# Patient Record
Sex: Male | Born: 1975 | Race: White | Hispanic: No | Marital: Single | Smoking: Never smoker
Health system: Southern US, Community
[De-identification: ages and names within clinical notes are randomized; demographics above are authoritative.]

## PROBLEM LIST (undated history)

## (undated) DIAGNOSIS — L570 Actinic keratosis: Secondary | ICD-10-CM

## (undated) HISTORY — DX: Actinic keratosis: L57.0

---

## 2007-06-14 ENCOUNTER — Emergency Department: Payer: Self-pay | Admitting: Internal Medicine

## 2009-02-27 ENCOUNTER — Ambulatory Visit: Payer: Self-pay | Admitting: General Surgery

## 2015-11-06 ENCOUNTER — Ambulatory Visit (INDEPENDENT_AMBULATORY_CARE_PROVIDER_SITE_OTHER): Payer: Worker's Compensation | Admitting: Physician Assistant

## 2015-11-06 VITALS — BP 122/72 | HR 55 | Temp 98.4°F | Resp 17 | Ht 69.0 in | Wt 171.0 lb

## 2015-11-06 DIAGNOSIS — S29012A Strain of muscle and tendon of back wall of thorax, initial encounter: Secondary | ICD-10-CM | POA: Diagnosis not present

## 2015-11-06 DIAGNOSIS — S233XXA Sprain of ligaments of thoracic spine, initial encounter: Secondary | ICD-10-CM | POA: Diagnosis not present

## 2015-11-06 MED ORDER — CYCLOBENZAPRINE HCL 10 MG PO TABS: 10.0000 mg | ORAL_TABLET | Freq: Three times a day (TID) | ORAL | Status: AC | PRN

## 2015-11-06 MED ORDER — NAPROXEN SODIUM 550 MG PO TABS: 550.0000 mg | ORAL_TABLET | Freq: Two times a day (BID) | ORAL | Status: AC

## 2015-11-06 NOTE — Progress Notes (Signed)
[facility] [address], [place] [address] [phone]  Date:  11/06/2015   Name:  Hector Vega   DOB:  03/14/1976   MRN:  [id number]  PCP:  No primary care provider on file.    Chief Complaint: Back Pain   History of Present Illness:  This is a 40 y.o. male who is presenting with back pain that occurred at work earlier this morning. A 200 pound fabric roll started to roll and he twisted his trunk to the left and grabbed the fabric roll and he got a sharp pain in his thoracic mid back. Pain is not radiating. No pain into arms or legs. Denies paresthesias, weakness, problems with bowel or bladder. Never injured back before. He does get back soreness every so often. He did take 800 mg ibuprofen before work this morning for back soreness.   Review of Systems:  Review of Systems See HPI  There are no active problems to display for this patient.   Prior to Admission medications   Not on File    Not on File  History reviewed. No pertinent past surgical history.  Medication list has been reviewed and updated.  Physical Examination:  Physical Exam  Constitutional: He is oriented to person, place, and time. He appears well-developed and well-nourished. No distress.  HENT:  Head: Normocephalic and atraumatic.  Right Ear: Hearing normal.  Left Ear: Hearing normal.  Nose: Nose normal.  Eyes: Conjunctivae and lids are normal. Right eye exhibits no discharge. Left eye exhibits no discharge. No scleral icterus.  Cardiovascular: Normal rate, regular rhythm, normal heart sounds and normal pulses.   No murmur heard. Pulmonary/Chest: Effort normal and breath sounds normal. No respiratory distress. He has no decreased breath sounds. He has no wheezes. He has no rhonchi. He has no rales.  Musculoskeletal: Normal range of motion.       Thoracic back: He exhibits normal range of motion, no tenderness and no bony tenderness.       Lumbar back: Normal.  He exhibits normal range of motion, no tenderness and no bony tenderness.  Pain with rotation of trunk. No tenderness in back  Neurological: He is alert and oriented to person, place, and time. He has normal strength and normal reflexes. No sensory deficit. Gait normal.  Skin: Skin is warm, dry and intact. No lesion and no rash noted.  Psychiatric: He has a normal mood and affect. His speech is normal and behavior is normal. Thought content normal.   BP 122/72 mmHg  Pulse 55  Temp(Src) 98.4 F (36.9 C) (Oral)  Resp 17  Ht 5\' 9"  (1.753 m)  Wt 171 lb (77.565 kg)  BMI 25.24 kg/m2  SpO2 99%  Assessment and Plan:  1. Thoracic sprain and strain, initial encounter Neuro exam normal, no bony tenderness - radiograph not indicated. Treat for strain with anaprox and flexeril. Counseled on heat, gentle massage, gentle stretching. He will be light duty at work and follow up on 11/13/15. - naproxen sodium (ANAPROX) 550 MG tablet; Take 1 tablet (550 mg total) by mouth 2 (two) times daily with a meal.  Dispense: 30 tablet; Refill: 0 - cyclobenzaprine (FLEXERIL) 10 MG tablet; Take 1 tablet (10 mg total) by mouth 3 (three) times daily as needed for muscle spasms.  Dispense: 30 tablet; Refill: 0   Benjaman Pott. Drenda Freeze, MHS [facility]  11/06/2015

## 2015-11-06 NOTE — Patient Instructions (Addendum)
Use anaprox twice daily. Do not use any other products with this containing ibuprofen, naprosyn or aspirin. You MAY use tylenol with this. Take flexeril at night. May cut in half and taking during the day if it doesn't make you too drowsy Heat, gentle massage and gentle stretching can help. Remain active, as inactivity can cause more pain. Don't do anything too strenuous though. If you develop new numbness, weakness or incontinence of urine or bowel, return to clinic or go to the emergency room ASAP. Return in 1 week for recheck or sooner if needed.     IF you received an x-ray today, you will receive an invoice from [facility]. Please contact [facility] at [phone] with questions or concerns regarding your invoice.   IF you received labwork today, you will receive an invoice from [employer]. Please contact [facility] at [phone] with questions or concerns regarding your invoice.   Our billing staff will not be able to assist you with questions regarding bills from these companies.  You will be contacted with the lab results as soon as they are available. The fastest way to get your results is to activate your My Chart account. Instructions are located on the last page of this paperwork. If you have not heard from [place] regarding the results in 2 weeks, please contact this office.

## 2015-11-13 ENCOUNTER — Ambulatory Visit (INDEPENDENT_AMBULATORY_CARE_PROVIDER_SITE_OTHER): Payer: Worker's Compensation | Admitting: Physician Assistant

## 2015-11-13 VITALS — BP 120/80 | HR 64 | Temp 97.6°F | Resp 18 | Ht 69.0 in | Wt 173.0 lb

## 2015-11-13 DIAGNOSIS — S29012D Strain of muscle and tendon of back wall of thorax, subsequent encounter: Secondary | ICD-10-CM

## 2015-11-13 DIAGNOSIS — S233XXD Sprain of ligaments of thoracic spine, subsequent encounter: Secondary | ICD-10-CM

## 2015-11-13 NOTE — Patient Instructions (Signed)
     IF you received an x-ray today, you will receive an invoice from [facility]. Please contact [facility] at [phone] with questions or concerns regarding your invoice.   IF you received labwork today, you will receive an invoice from [facility]. Please contact [facility] at [phone] with questions or concerns regarding your invoice.   Our billing staff will not be able to assist you with questions regarding bills from these companies.  You will be contacted with the lab results as soon as they are available. The fastest way to get your results is to activate your My Chart account. Instructions are located on the last page of this paperwork. If you have not heard from us regarding the results in 2 weeks, please contact this office.      

## 2015-11-13 NOTE — Progress Notes (Signed)
[facility] [address], [place] [address] [phone]  Date:  11/13/2015   Name:  Hector Vega   DOB:  1975/06/01   MRN:  [id number]  PCP:  No primary care provider on file.    Chief Complaint: Follow-up (w/c)   History of Present Illness:  This is a 40 y.o. male who is presenting for w/c follow up. I saw pt here 1 week ago on 7/18 after back injury at work. He started having thoracic back pain after trying to stop a 200 pound fabric roll from rolling, he twisted his trunk to brace the fabric roll and that's when he got pain. Neuro exam normal and no bony tenderness at that time. Diagnosed with thoracic strain. He was prescribed anaprox and flexeril. These have helped him.  He is reporting today that he is 80% better. He stayed out of work until yesterday. Went back full duty yesterday. States his back felt sore after work but overall he did well. Really only has pain with rotating his back. He is ready to go back to work full duty.  Denies paresthesias, radiation of pain, leg or arm weakness, problems with bowel or bladder.  Review of Systems:  Review of Systems See HPI  There are no active problems to display for this patient.   Prior to Admission medications   Medication Sig Start Date End Date Taking? Authorizing Provider  cyclobenzaprine (FLEXERIL) 10 MG tablet Take 1 tablet (10 mg total) by mouth 3 (three) times daily as needed for muscle spasms. 11/06/15   Ezekiel Slocumb, PA-C  naproxen sodium (ANAPROX) 550 MG tablet Take 1 tablet (550 mg total) by mouth 2 (two) times daily with a meal. 11/06/15   Ezekiel Slocumb, PA-C    History reviewed. No pertinent surgical history.  Medication list has been reviewed and updated.  Physical Examination:  Physical Exam  Constitutional: He is oriented to person, place, and time. He appears well-developed and well-nourished. No distress.  HENT:  Head: Normocephalic and atraumatic.  Right Ear: Hearing  normal.  Left Ear: Hearing normal.  Nose: Nose normal.  Eyes: Conjunctivae and lids are normal. Right eye exhibits no discharge. Left eye exhibits no discharge. No scleral icterus.  Pulmonary/Chest: Effort normal. No respiratory distress.  Musculoskeletal: Normal range of motion.       Thoracic back: Normal. He exhibits normal range of motion, no tenderness and no bony tenderness.  Pain in left thoracic region with rotation of trunk.  Neurological: He is alert and oriented to person, place, and time.  Skin: Skin is warm, dry and intact. No lesion and no rash noted.  Psychiatric: He has a normal mood and affect. His speech is normal and behavior is normal. Thought content normal.   BP 120/80   Pulse 64   Temp 97.6 F (36.4 C) (Oral)   Resp 18   Ht 5\' 9"  (1.753 m)   Wt 173 lb (78.5 kg)   SpO2 100%   BMI 25.55 kg/m   Assessment and Plan:  1. Thoracic sprain and strain, subsequent encounter 80% improved. He is ready to go back to work full duty. We discussed proper body mechanics with lifting and transferring. Continue anaprox and flexeril as needed. Return if needed.   Benjaman Pott Drenda Freeze, MHS [facility]  11/13/2015

## 2017-09-17 ENCOUNTER — Other Ambulatory Visit: Payer: Self-pay | Admitting: Internal Medicine

## 2017-09-17 DIAGNOSIS — R918 Other nonspecific abnormal finding of lung field: Secondary | ICD-10-CM

## 2017-09-23 ENCOUNTER — Encounter (INDEPENDENT_AMBULATORY_CARE_PROVIDER_SITE_OTHER): Payer: Self-pay

## 2017-09-23 ENCOUNTER — Ambulatory Visit
Admission: RE | Admit: 2017-09-23 | Discharge: 2017-09-23 | Disposition: A | Discharge status: Home / Self Care | Payer: BC Managed Care – PPO | Source: Ambulatory Visit | Attending: Internal Medicine | Admitting: Internal Medicine

## 2017-09-23 DIAGNOSIS — R918 Other nonspecific abnormal finding of lung field: Secondary | ICD-10-CM | POA: Insufficient documentation

## 2017-09-23 MED ORDER — IOPAMIDOL (ISOVUE-300) INJECTION 61%
100.0000 mL | Freq: Once | INTRAVENOUS | Status: AC | PRN
  Administered 2017-09-23: 75 mL via INTRAVENOUS

## 2019-02-18 ENCOUNTER — Other Ambulatory Visit: Payer: Self-pay

## 2019-02-18 DIAGNOSIS — Z20822 Contact with and (suspected) exposure to covid-19: Secondary | ICD-10-CM

## 2019-02-19 LAB — NOVEL CORONAVIRUS, NAA: SARS-CoV-2, NAA: NOT DETECTED

## 2019-07-06 IMAGING — CT CT CHEST W/ CM
1 series · 15 of 34 positions shown, 19 images · IV contrast (iopamidol)
Comparison: Previous chest x-ray is not available for comparison.

CLINICAL DATA: Abnormal chest x-ray.  Pulmonary nodules.

EXAM:
CT CHEST WITH CONTRAST
TECHNIQUE: Multidetector CT imaging of the chest was performed during
intravenous contrast administration.
CONTRAST:  75mL 9LOB9Z-OMM IOPAMIDOL (9LOB9Z-OMM) INJECTION 61%

[Series 2: axial st · axial · 0.70mm/px · z∈[-563,-275]mm · 15 of 170 slices shown, 19 images]
[im 13/170  mediastinal]
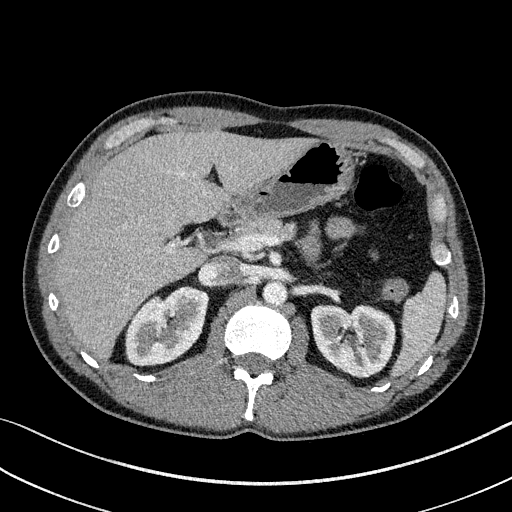
[im 13/170  lung]
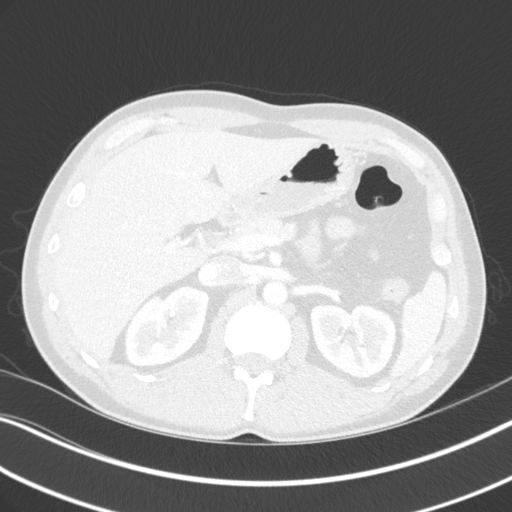
[im 26/170  lung]
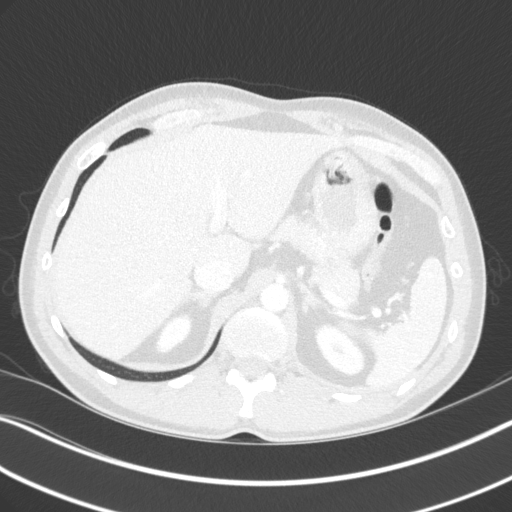
[im 34/170  lung]
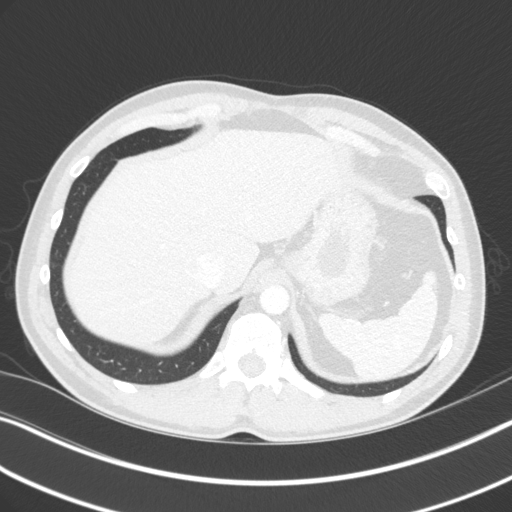
[im 44/170  lung]
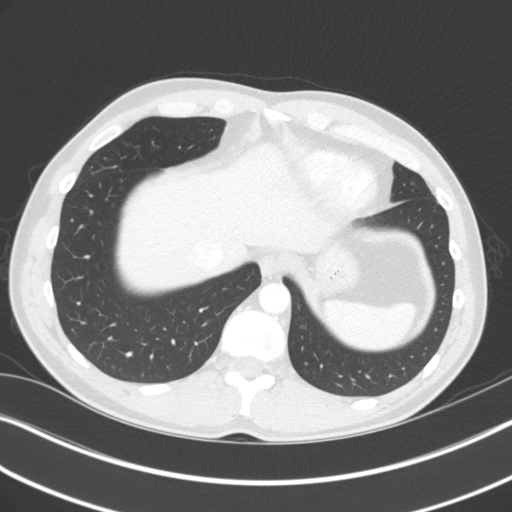
[im 57/170  mediastinal]
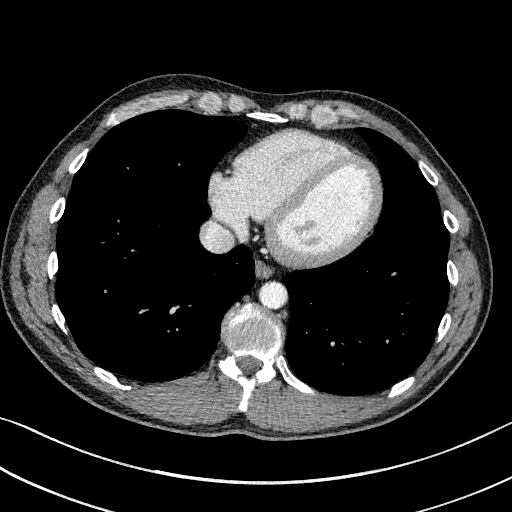
[im 57/170  lung]
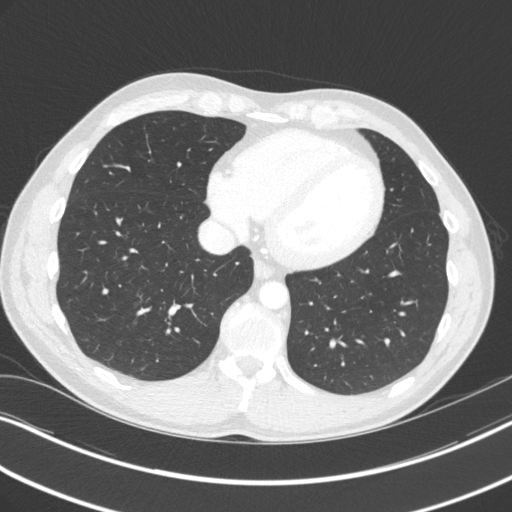
[im 68/170  lung]
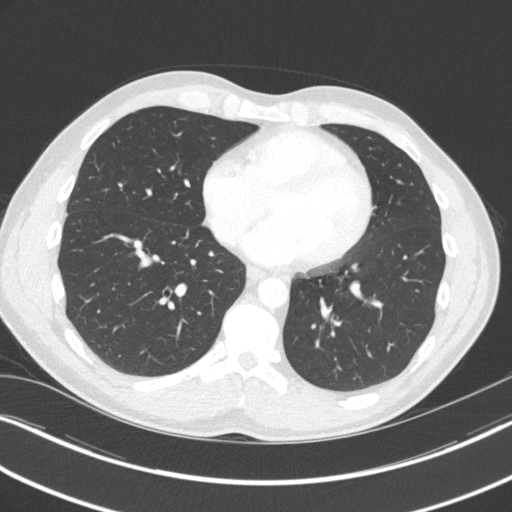
[im 76/170  lung]
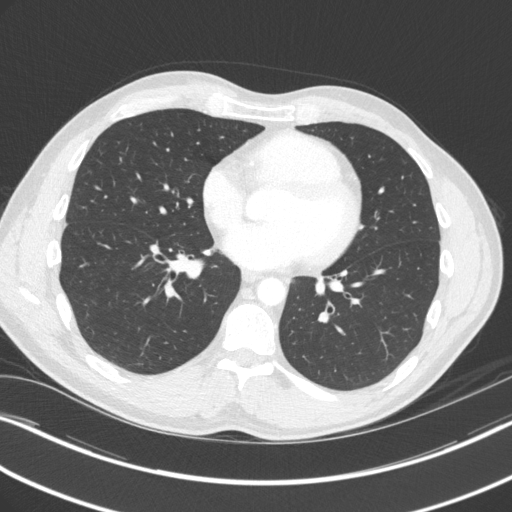
[im 88/170  lung]
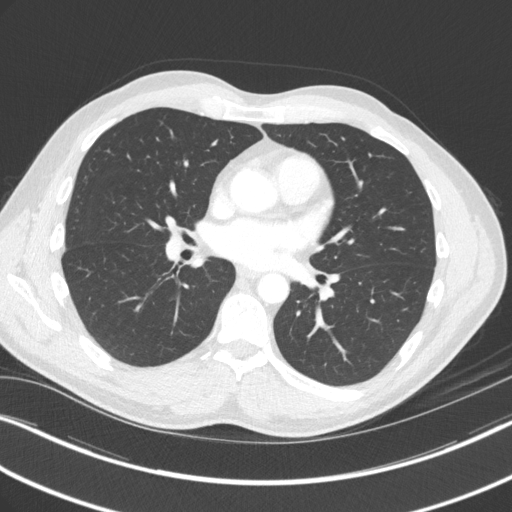
[im 94/170  mediastinal]
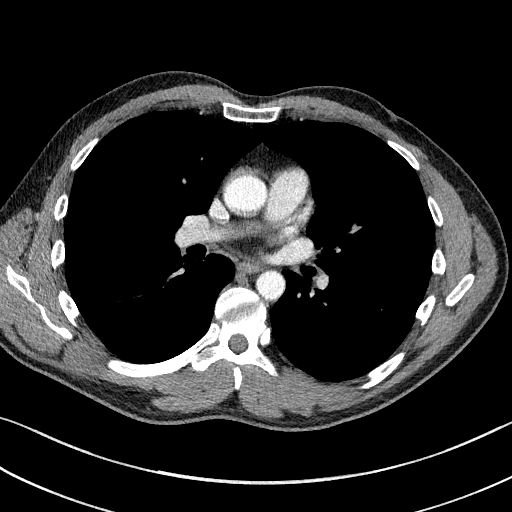
[im 94/170  lung]
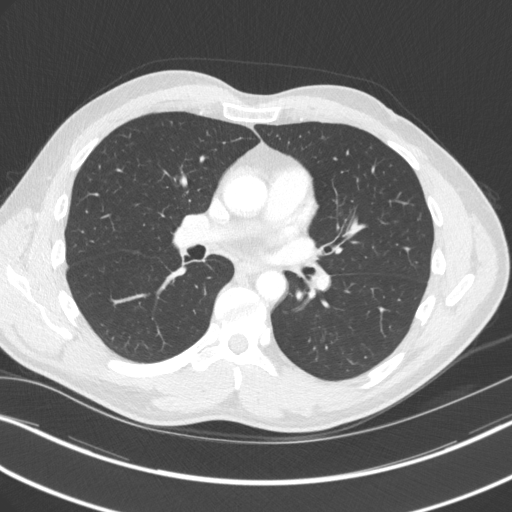
[im 102/170  lung]
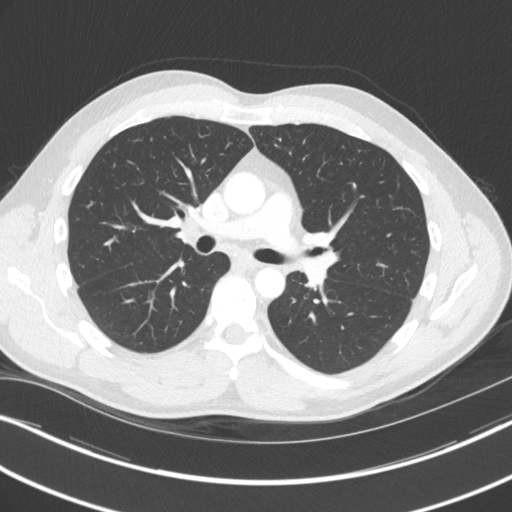
[im 113/170  lung]
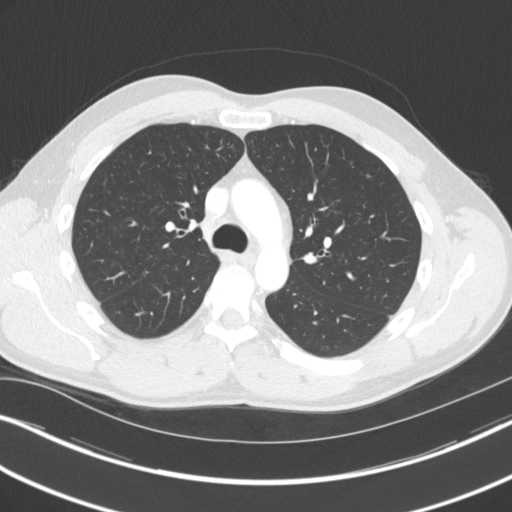
[im 126/170  lung]
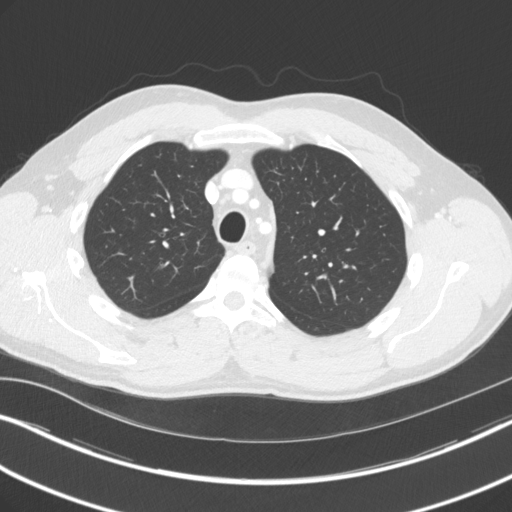
[im 136/170  mediastinal]
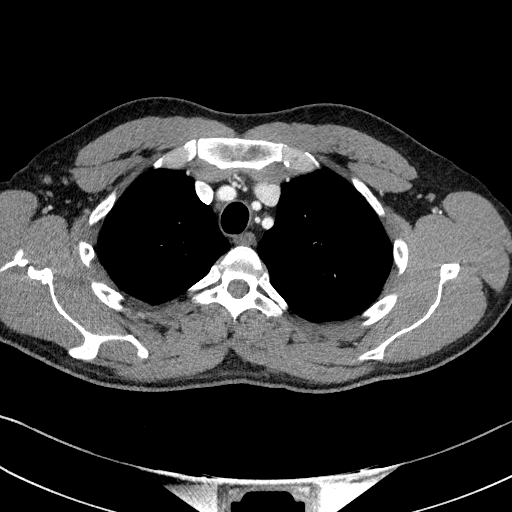
[im 136/170  lung]
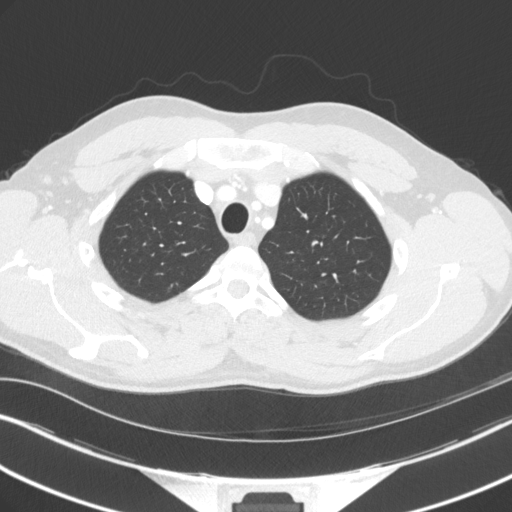
[im 144/170  lung]
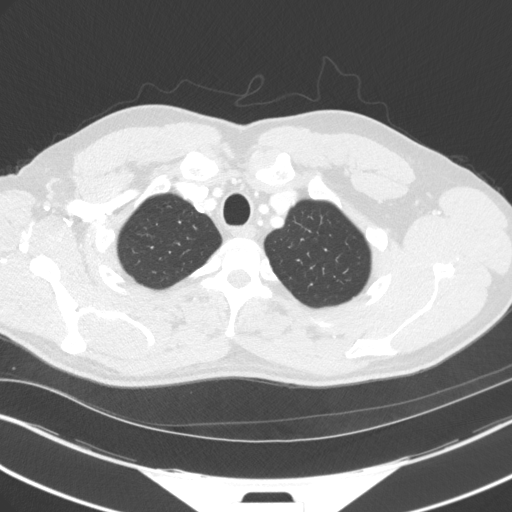
[im 157/170  lung]
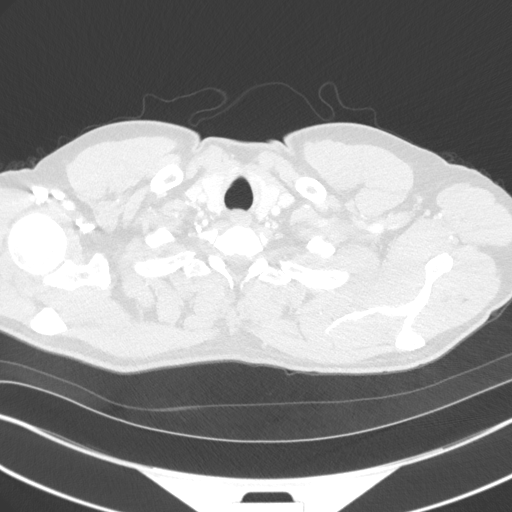

[15 of 34 positions shown; findings below may reference images not displayed]

FINDINGS: Cardiovascular: Heart size is normal. Aorta and great vessel origins
are within normal limits. The left vertebral artery originates
directly from the aortic arch, a normal variant. Pulmonary arteries
are within normal limits.

Mediastinum/Nodes: No significant mediastinal, hilar, or axillary
adenopathy is present. Thoracic inlet is within normal limits.
Esophagus is unremarkable.

Lungs/Pleura: The lungs are clear without focal nodule, mass, or
airspace disease. No significant pleural or pericardial effusion is
present.

Upper Abdomen: Limited imaging of the upper abdomen is unremarkable.

Musculoskeletal: Vertebral body heights and alignment are
maintained. No focal lytic or blastic lesions are present. The ribs
are unremarkable. Sternum is within normal limits.
IMPRESSION: 1. No significant pulmonary nodules or focal lung disease.
2. Negative CT of the chest with contrast.

## 2019-12-28 ENCOUNTER — Other Ambulatory Visit: Payer: BC Managed Care – PPO

## 2019-12-28 ENCOUNTER — Other Ambulatory Visit: Payer: Self-pay

## 2019-12-28 DIAGNOSIS — Z20822 Contact with and (suspected) exposure to covid-19: Secondary | ICD-10-CM

## 2019-12-30 LAB — NOVEL CORONAVIRUS, NAA: SARS-CoV-2, NAA: NOT DETECTED

## 2019-12-30 LAB — SARS-COV-2, NAA 2 DAY TAT

## 2020-01-02 ENCOUNTER — Other Ambulatory Visit: Payer: BC Managed Care – PPO

## 2020-01-02 ENCOUNTER — Other Ambulatory Visit: Payer: Self-pay

## 2020-01-02 DIAGNOSIS — Z20822 Contact with and (suspected) exposure to covid-19: Secondary | ICD-10-CM

## 2020-01-03 LAB — NOVEL CORONAVIRUS, NAA: SARS-CoV-2, NAA: NOT DETECTED

## 2020-01-03 LAB — SARS-COV-2, NAA 2 DAY TAT

## 2020-01-09 ENCOUNTER — Other Ambulatory Visit: Payer: Self-pay

## 2020-01-09 ENCOUNTER — Other Ambulatory Visit: Payer: BC Managed Care – PPO

## 2020-01-09 DIAGNOSIS — Z20822 Contact with and (suspected) exposure to covid-19: Secondary | ICD-10-CM

## 2020-01-10 LAB — SARS-COV-2, NAA 2 DAY TAT

## 2020-01-10 LAB — NOVEL CORONAVIRUS, NAA: SARS-CoV-2, NAA: DETECTED — AB

## 2020-01-11 ENCOUNTER — Telehealth: Payer: Self-pay | Admitting: Adult Health

## 2020-01-11 NOTE — Telephone Encounter (Signed)
Called to Discuss with patient about Covid symptoms and the use of the monoclonal antibody infusion for those with mild to moderate Covid symptoms and at a high risk of hospitalization.     Pt is qualified for this infusion due to co-morbid conditions and/or a member of an at-risk group.   BMI   Patient declines infusion at this time. Feeling better w/ minimal symptoms   Tranise Forrest NP-C

## 2021-01-30 ENCOUNTER — Ambulatory Visit: Payer: Self-pay | Admitting: Dermatology

## 2021-07-01 ENCOUNTER — Ambulatory Visit: Payer: Self-pay | Admitting: Dermatology

## 2021-07-31 ENCOUNTER — Ambulatory Visit: Payer: Self-pay | Admitting: Dermatology

## 2021-12-19 ENCOUNTER — Ambulatory Visit: Payer: BC Managed Care – PPO | Admitting: Dermatology

## 2021-12-19 ENCOUNTER — Encounter: Payer: Self-pay | Admitting: Dermatology

## 2021-12-19 DIAGNOSIS — L821 Other seborrheic keratosis: Secondary | ICD-10-CM

## 2021-12-19 DIAGNOSIS — L578 Other skin changes due to chronic exposure to nonionizing radiation: Secondary | ICD-10-CM | POA: Diagnosis not present

## 2021-12-19 DIAGNOSIS — L918 Other hypertrophic disorders of the skin: Secondary | ICD-10-CM

## 2021-12-19 DIAGNOSIS — L570 Actinic keratosis: Secondary | ICD-10-CM

## 2021-12-19 DIAGNOSIS — L814 Other melanin hyperpigmentation: Secondary | ICD-10-CM

## 2021-12-19 DIAGNOSIS — Z1283 Encounter for screening for malignant neoplasm of skin: Secondary | ICD-10-CM | POA: Diagnosis not present

## 2021-12-19 DIAGNOSIS — D225 Melanocytic nevi of trunk: Secondary | ICD-10-CM

## 2021-12-19 NOTE — Patient Instructions (Addendum)
Cryotherapy Aftercare  Wash gently with soap and water everyday.   Apply Vaseline and Band-Aid daily until healed.    Actinic Keratosis  What is an actinic keratosis? An actinic keratosis (plural: actinic keratoses) is growth on the surface of the skin that usually appears as a red, hard, crusty or scaly bump.   What causes actinic keratoses? Repeated prolonged sun exposure causes skin damage, especially in fair-skinned persons. Sun-damaged skin becomes dry and wrinkled and may form rough, scaly spots called actinic keratoses. These rough spots remain on the skin even though the crust or scale on top is picked off.   Why treat actinic keratoses? Actinic keratoses are not skin cancers, but because they may sometimes turn cancerous they are called "pre-cancerous". Not all will turn to skin cancer, and it usually takes several years for this to happen. Because it is much easier to treat an actinic keratosis then it is to remove a skin cancer, actinic keratoses should be treated to prevent future skin cancer.   How are actinic keratoses treated? The most common way of treating actinic keratoses is to freeze them with liquid nitrogen. Freezing causes scabbing and shedding of the sun-damaged skin. Healing after a removal usually takes two weeks, depending on the size and location of the keratosis. Hands and legs heal more slowly than the face. The skin's final appearance is usually excellent. There are several topical medications that can be used to treat actinic keratoses. These medications generally have side effects of redness, crusting, and pain. Some are used for a few days, and some for several months before the actinic keratosis is completely gone. Photodynamic therapy is another alternative to freezing actinic keratoses. This treatment is done in a physician's office. A medication is applied to the area of skin with actinic keratoses, and it is allowed to soak in for one or more hours. A special  light is then applied to the skin. Side effects include redness, burning, and peeling.  How can you prevent actinic keratoses? Protection from the sun is the best way to prevent actinic keratoses. The use of proper clothing and sunscreens can prevent the sun damage that leads to an actinic keratosis.  Unfortunately, some sun damage is permanent. Once sun damage has progressed to the point where actinic keratoses develop, new keratoses may appear even without further sun exposure. However, even in skin that is already heavily sun damaged, good sun protection can help reduce the number of actinic keratoses that will appear.       Due to recent changes in healthcare laws, you may see results of your pathology and/or laboratory studies on MyChart before the doctors have had a chance to review them. We understand that in some cases there may be results that are confusing or concerning to you. Please understand that not all results are received at the same time and often the doctors may need to interpret multiple results in order to provide you with the best plan of care or course of treatment. Therefore, we ask that you please give [place] 2 business days to thoroughly review all your results before contacting the office for clarification. Should we see a critical lab result, you will be contacted sooner.   If You Need Anything After Your Visit  If you have any questions or concerns for your doctor, please call our main line at [phone] and press option 4 to reach your doctor's medical assistant. If no one answers, please leave a voicemail as directed and we  will return your call as soon as possible. Messages left after 4 pm will be answered the following business day.   You may also send [place] a message via [place]. We typically respond to MyChart messages within 1-2 business days.  For prescription refills, please ask your pharmacy to contact our office. Our fax number is [phone].  If you have an  urgent issue when the clinic is closed that cannot wait until the next business day, you can page your doctor at the number below.    Please note that while we do our best to be available for urgent issues outside of office hours, we are not available 24/7.   If you have an urgent issue and are unable to reach [place], you may choose to seek medical care at your doctor's office, retail clinic, urgent care center, or emergency room.  If you have a medical emergency, please immediately call 911 or go to the emergency department.  Pager Numbers  - Dr. Nehemiah Massed: [phone]  - Dr. Laurence Ferrari: [phone]  - Dr. Nicole Kindred: [phone]  In the event of inclement weather, please call our main line at [phone] for an update on the status of any delays or closures.  Dermatology Medication Tips: Please keep the boxes that topical medications come in in order to help keep track of the instructions about where and how to use these. Pharmacies typically print the medication instructions only on the boxes and not directly on the medication tubes.   If your medication is too expensive, please contact our office at [phone] option 4 or send [place] a message through [place].   We are unable to tell what your co-pay for medications will be in advance as this is different depending on your insurance coverage. However, we may be able to find a substitute medication at lower cost or fill out paperwork to get insurance to cover a needed medication.   If a prior authorization is required to get your medication covered by your insurance company, please allow [place] 1-2 business days to complete this process.  Drug prices often vary depending on where the prescription is filled and some pharmacies may offer cheaper prices.  The website www.goodrx.com contains coupons for medications through different pharmacies. The prices here do not account for what the cost may be with help from insurance (it may be cheaper with your  insurance), but the website can give you the price if you did not use any insurance.  - You can print the associated coupon and take it with your prescription to the pharmacy.  - You may also stop by our office during regular business hours and pick up a GoodRx coupon card.  - If you need your prescription sent electronically to a different pharmacy, notify our office through [facility] or by phone at [phone] option 4.     Si Usted Necesita Algo Despus de Su Visita  Tambin puede enviarnos un mensaje a travs de Pharmacist, community. Por lo general respondemos a los mensajes de MyChart en el transcurso de 1 a 2 das hbiles.  Para renovar recetas, por favor pida a su farmacia que se ponga en contacto con nuestra oficina. Harland Dingwall de fax es [place] [phone].  Si tiene un asunto urgente cuando la clnica est cerrada y que no puede esperar hasta el siguiente da hbil, puede llamar/localizar a su doctor(a) al nmero que aparece a continuacin.   Por favor, tenga en cuenta que aunque hacemos todo lo posible para estar disponibles para  asuntos urgentes fuera del horario de [place], no estamos disponibles las 24 horas del da, los 7 das de la [place].   Si tiene un problema urgente y no puede comunicarse con nosotros, puede optar por buscar atencin mdica  en el consultorio de su doctor(a), en una clnica privada, en un centro de atencin urgente o en una sala de emergencias.  Si tiene Engineering geologist, por favor llame inmediatamente al 911 o vaya a la sala de emergencias.  Nmeros de bper  - Dr. Nehemiah Massed: [phone]  - Dra. Moye: [phone]  - Dra. Nicole Kindred: [phone]  En caso de inclemencias del [place], por favor llame a Johnsie Kindred principal al [phone] para una actualizacin sobre el [place] de cualquier retraso o cierre.  Consejos para la medicacin en dermatologa: Por favor, guarde las cajas en las que vienen los medicamentos de uso tpico para ayudarle a  seguir las instrucciones sobre dnde y cmo usarlos. Las farmacias generalmente imprimen las instrucciones del medicamento slo en las cajas y no directamente en los tubos del [place].   Si su medicamento es muy caro, por favor, pngase en contacto con Zigmund Daniel llamando al [phone] y presione la opcin 4 o envenos un mensaje a travs de Pharmacist, community.   No podemos decirle cul ser su copago por los medicamentos por adelantado ya que esto es diferente dependiendo de la cobertura de su seguro. Sin embargo, es posible que podamos encontrar un medicamento sustituto a Electrical engineer un formulario para que el seguro cubra el medicamento que se considera necesario.   Si se requiere una autorizacin previa para que su compaa de seguros [place] su medicamento, por favor permtanos de 1 a 2 das hbiles para completar este proceso.  Los precios de los medicamentos varan con frecuencia dependiendo del Environmental consultant de dnde se surte la receta y alguna farmacias pueden ofrecer precios ms baratos.  El sitio web www.goodrx.com tiene cupones para medicamentos de Airline pilot. Los precios aqu no tienen en cuenta lo que podra costar con la ayuda del seguro (puede ser ms barato con su seguro), pero el sitio web puede darle el precio si no utiliz Research scientist (physical sciences).  - Puede imprimir el cupn correspondiente y llevarlo con su receta a la farmacia.  - Tambin puede pasar por nuestra oficina durante el horario de atencin regular y Charity fundraiser una tarjeta de cupones de GoodRx.  - Si necesita que su receta se enve electrnicamente a una farmacia diferente, informe a nuestra oficina a travs de MyChart de [place] o por telfono llamando al [phone] y presione la opcin 4.

## 2021-12-19 NOTE — Progress Notes (Signed)
New Patient Visit  Subjective  Hector Vega is a 46 y.o. male who presents for the following: Total body skin exam (No hx of skin Cancer, no fhx of skin cancer) and Skin Tag (L axilla, >84m.  New patient referral from Dr. JHarrel Lemon  The patient presents for Total-Body Skin Exam (TBSE) for skin cancer screening and mole check.  The patient has spots, moles and lesions to be evaluated, some may be new or changing and the patient has concerns that these could be cancer.   The following portions of the chart were reviewed this encounter and updated as appropriate:   Tobacco  Allergies  Meds  Problems  Med Hx  Surg Hx  Fam Hx     Review of Systems:  No other skin or systemic complaints except as noted in HPI or Assessment and Plan.  Objective  Well appearing patient in no apparent distress; mood and affect are within normal limits.  A full examination was performed including scalp, head, eyes, ears, nose, lips, neck, chest, axillae, abdomen, back, buttocks, bilateral upper extremities, bilateral lower extremities, hands, feet, fingers, toes, fingernails, and toenails. All findings within normal limits unless otherwise noted below.  L ear x 1, R ear x 3 (4) Pink scaly macules   Assessment & Plan   Lentigines - Scattered tan macules - Due to sun exposure - Benign-appearing, observe - Recommend daily broad spectrum sunscreen SPF 30+ to sun-exposed areas, reapply every 2 hours as needed. - Call for any changes - back  Seborrheic Keratoses - Stuck-on, waxy, tan-brown papules and/or plaques  - Benign-appearing - Discussed benign etiology and prognosis. - Observe - Call for any changes - hands  Melanocytic Nevi - Tan-brown and/or pink-flesh-colored symmetric macules and papules - Benign appearing on exam today - Observation - Call clinic for new or changing moles - Recommend daily use of broad spectrum spf 30+ sunscreen to sun-exposed areas.  -  trunk  Actinic Damage - Chronic condition, secondary to cumulative UV/sun exposure - diffuse scaly erythematous macules with underlying dyspigmentation - Recommend daily broad spectrum sunscreen SPF 30+ to sun-exposed areas, reapply every 2 hours as needed.  - Staying in the shade or wearing long sleeves, sun glasses (UVA+UVB protection) and wide brim hats (4-inch brim around the entire circumference of the hat) are also recommended for sun protection.  - Call for new or changing lesions. - back  Skin cancer screening performed today.   AK (actinic keratosis) (4) L ear x 1, R ear x 3  Destruction of lesion - L ear x 1, R ear x 3 Complexity: simple   Destruction method: cryotherapy   Informed consent: discussed and consent obtained   Timeout:  patient name, date of birth, surgical site, and procedure verified Lesion destroyed using liquid nitrogen: Yes   Region frozen until ice ball extended beyond lesion: Yes   Outcome: patient tolerated procedure well with no complications   Post-procedure details: wound care instructions given    Acrochordons (Skin Tags) - Fleshy, skin-colored pedunculated papules - Benign appearing.  - Observe. - If desired, they can be removed with an in office procedure that is not covered by insurance. - Please call the clinic if you notice any new or changing lesions.   Return in about 1 year (around 12/20/2022) for TBSE, Hx of AKs.  I, SOthelia Pulling RMA, am acting as scribe for DSarina Ser MD . Documentation: I have reviewed the above documentation for accuracy and completeness, and I  agree with the above.  Sarina Ser, MD

## 2021-12-27 ENCOUNTER — Encounter: Payer: Self-pay | Admitting: Dermatology

## 2023-01-08 ENCOUNTER — Encounter: Payer: BC Managed Care – PPO | Admitting: Dermatology
